# Patient Record
Sex: Female | Born: 1990 | Race: White | Hispanic: No | Marital: Single | Smoking: Current some day smoker
Health system: Southern US, Community
[De-identification: ages and names within clinical notes are randomized; demographics above are authoritative.]

## PROBLEM LIST (undated history)

## (undated) ENCOUNTER — Inpatient Hospital Stay (HOSPITAL_COMMUNITY): Payer: Self-pay

## (undated) ENCOUNTER — Inpatient Hospital Stay: Payer: Self-pay

## (undated) DIAGNOSIS — F32A Depression, unspecified: Secondary | ICD-10-CM

## (undated) DIAGNOSIS — F419 Anxiety disorder, unspecified: Secondary | ICD-10-CM

## (undated) DIAGNOSIS — O98812 Other maternal infectious and parasitic diseases complicating pregnancy, second trimester: Secondary | ICD-10-CM

## (undated) DIAGNOSIS — F329 Major depressive disorder, single episode, unspecified: Secondary | ICD-10-CM

## (undated) DIAGNOSIS — D649 Anemia, unspecified: Secondary | ICD-10-CM

## (undated) DIAGNOSIS — A749 Chlamydial infection, unspecified: Secondary | ICD-10-CM

## (undated) HISTORY — PX: NO PAST SURGERIES: SHX2092

---
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
## 2012-08-29 ENCOUNTER — Emergency Department: Payer: Self-pay | Admitting: Emergency Medicine

## 2012-08-29 LAB — CBC
HCT: 34.6 % — ABNORMAL LOW (ref 35.0–47.0)
HGB: 12 g/dL (ref 12.0–16.0)
MCH: 28.9 pg (ref 26.0–34.0)
MCHC: 34.6 g/dL (ref 32.0–36.0)
Platelet: 238 10*3/uL (ref 150–440)
RBC: 4.13 10*6/uL (ref 3.80–5.20)
RDW: 13.7 % (ref 11.5–14.5)
WBC: 9 10*3/uL (ref 3.6–11.0)

## 2012-08-29 LAB — URINALYSIS, COMPLETE
Bilirubin,UR: NEGATIVE
Blood: NEGATIVE
Ketone: NEGATIVE
Nitrite: NEGATIVE
Ph: 7 (ref 4.5–8.0)
Protein: NEGATIVE
WBC UR: 1 /HPF (ref 0–5)

## 2013-01-24 ENCOUNTER — Observation Stay: Payer: Self-pay | Admitting: Obstetrics & Gynecology

## 2013-01-24 LAB — CBC WITH DIFFERENTIAL/PLATELET
Basophil #: 0.1 10*3/uL (ref 0.0–0.1)
Basophil %: 1.3 %
EOS ABS: 0.2 10*3/uL (ref 0.0–0.7)
Eosinophil %: 2 %
HCT: 30.8 % — ABNORMAL LOW (ref 35.0–47.0)
HGB: 10.6 g/dL — AB (ref 12.0–16.0)
LYMPHS PCT: 21.3 %
Lymphocyte #: 2.2 10*3/uL (ref 1.0–3.6)
MCH: 27.8 pg (ref 26.0–34.0)
MCHC: 34.4 g/dL (ref 32.0–36.0)
MCV: 81 fL (ref 80–100)
Monocyte #: 0.7 x10 3/mm (ref 0.2–0.9)
Monocyte %: 7 %
Neutrophil #: 6.9 10*3/uL — ABNORMAL HIGH (ref 1.4–6.5)
Neutrophil %: 68.4 %
PLATELETS: 313 10*3/uL (ref 150–440)
RBC: 3.81 10*6/uL (ref 3.80–5.20)
RDW: 13.4 % (ref 11.5–14.5)
WBC: 10.2 10*3/uL (ref 3.6–11.0)

## 2013-01-24 LAB — PROTIME-INR
INR: 1
PROTHROMBIN TIME: 13.2 s (ref 11.5–14.7)

## 2013-01-24 LAB — APTT: Activated PTT: 27.1 secs (ref 23.6–35.9)

## 2013-02-12 ENCOUNTER — Observation Stay: Payer: Self-pay | Admitting: Obstetrics and Gynecology

## 2013-02-12 LAB — GLUCOSE, RANDOM: Glucose: 81 mg/dL (ref 65–99)

## 2013-02-12 LAB — DRUG SCREEN, URINE
AMPHETAMINES, UR SCREEN: NEGATIVE (ref ?–1000)
BENZODIAZEPINE, UR SCRN: NEGATIVE (ref ?–200)
Barbiturates, Ur Screen: NEGATIVE (ref ?–200)
CANNABINOID 50 NG, UR ~~LOC~~: POSITIVE (ref ?–50)
COCAINE METABOLITE, UR ~~LOC~~: NEGATIVE (ref ?–300)
MDMA (ECSTASY) UR SCREEN: NEGATIVE (ref ?–500)
Methadone, Ur Screen: NEGATIVE (ref ?–300)
OPIATE, UR SCREEN: NEGATIVE (ref ?–300)
PHENCYCLIDINE (PCP) UR S: NEGATIVE (ref ?–25)
Tricyclic, Ur Screen: NEGATIVE (ref ?–1000)

## 2013-02-12 LAB — WBC: WBC: 9.7 10*3/uL (ref 3.6–11.0)

## 2013-02-12 LAB — HEMATOCRIT: HCT: 32.3 % — AB (ref 35.0–47.0)

## 2013-02-12 LAB — HEMOGLOBIN: HGB: 10.7 g/dL — ABNORMAL LOW (ref 12.0–16.0)

## 2013-02-12 LAB — GC/CHLAMYDIA PROBE AMP

## 2013-02-12 LAB — RAPID HIV-1/2 QL/CONFIRM: HIV-1/2, RAPID QL: NEGATIVE

## 2013-02-12 LAB — PLATELET COUNT: Platelet: 295 10*3/uL (ref 150–440)

## 2013-02-14 ENCOUNTER — Inpatient Hospital Stay: Payer: Self-pay | Admitting: Obstetrics and Gynecology

## 2013-02-14 LAB — CBC WITH DIFFERENTIAL/PLATELET
BASOS ABS: 0.1 10*3/uL (ref 0.0–0.1)
Basophil %: 0.6 %
EOS ABS: 0.3 10*3/uL (ref 0.0–0.7)
EOS PCT: 2.4 %
HCT: 30.8 % — ABNORMAL LOW (ref 35.0–47.0)
HGB: 10.3 g/dL — ABNORMAL LOW (ref 12.0–16.0)
LYMPHS ABS: 1.6 10*3/uL (ref 1.0–3.6)
LYMPHS PCT: 15.1 %
MCH: 27.4 pg (ref 26.0–34.0)
MCHC: 33.5 g/dL (ref 32.0–36.0)
MCV: 82 fL (ref 80–100)
MONO ABS: 0.7 x10 3/mm (ref 0.2–0.9)
MONOS PCT: 6.8 %
NEUTROS ABS: 8 10*3/uL — AB (ref 1.4–6.5)
NEUTROS PCT: 75.1 %
Platelet: 294 10*3/uL (ref 150–440)
RBC: 3.76 10*6/uL — AB (ref 3.80–5.20)
RDW: 14.3 % (ref 11.5–14.5)
WBC: 10.7 10*3/uL (ref 3.6–11.0)

## 2013-02-14 LAB — GC/CHLAMYDIA PROBE AMP

## 2013-02-16 LAB — HEMATOCRIT: HCT: 27.1 % — AB (ref 35.0–47.0)
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
## 2014-05-05 NOTE — Consult Note (Signed)
Brief Consult Note: Diagnosis: adjustment disorder with anxiety.   Patient was seen by consultant.   Consult note dictated.   Orders entered.   Comments: PSychiatry: Pt seen. chart reviewed. Spoke with attending and current nurse. Full note dictated. Bottom line: No indication to start any medication right now. No sign of dangerousness that would require any longer hospital stay. Patient should be strongly urged to go to [facility] [address] [place] [phone] for follow up. She can call them or go to walk in eval M,W,F 8am-3pm. They provide full range mental health services including counceling and evaluation.  Electronic Signatures: Audery Amellapacs, Wilbert Schouten T (MD)  (Signed 06-Feb-15 12:50)  Authored: Brief Consult Note   Last Updated: 06-Feb-15 12:50 by Audery Amellapacs, Mory Herrman T (MD)
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
## 2015-12-29 ENCOUNTER — Observation Stay
Admission: EM | Admit: 2015-12-29 | Discharge: 2015-12-29 | Disposition: A | Discharge status: Home / Self Care | Payer: Medicaid Other | Attending: Obstetrics and Gynecology | Admitting: Obstetrics and Gynecology

## 2015-12-29 DIAGNOSIS — O98813 Other maternal infectious and parasitic diseases complicating pregnancy, third trimester: Secondary | ICD-10-CM | POA: Insufficient documentation

## 2015-12-29 DIAGNOSIS — O26899 Other specified pregnancy related conditions, unspecified trimester: Secondary | ICD-10-CM | POA: Diagnosis present

## 2015-12-29 DIAGNOSIS — R1030 Lower abdominal pain, unspecified: Secondary | ICD-10-CM | POA: Insufficient documentation

## 2015-12-29 DIAGNOSIS — O99333 Smoking (tobacco) complicating pregnancy, third trimester: Secondary | ICD-10-CM | POA: Insufficient documentation

## 2015-12-29 DIAGNOSIS — Z3A28 28 weeks gestation of pregnancy: Secondary | ICD-10-CM | POA: Insufficient documentation

## 2015-12-29 DIAGNOSIS — O26893 Other specified pregnancy related conditions, third trimester: Secondary | ICD-10-CM | POA: Diagnosis not present

## 2015-12-29 DIAGNOSIS — R109 Unspecified abdominal pain: Secondary | ICD-10-CM

## 2015-12-29 DIAGNOSIS — O98313 Other infections with a predominantly sexual mode of transmission complicating pregnancy, third trimester: Secondary | ICD-10-CM | POA: Insufficient documentation

## 2015-12-29 DIAGNOSIS — F17210 Nicotine dependence, cigarettes, uncomplicated: Secondary | ICD-10-CM | POA: Diagnosis not present

## 2015-12-29 DIAGNOSIS — A5901 Trichomonal vulvovaginitis: Secondary | ICD-10-CM | POA: Diagnosis not present

## 2015-12-29 DIAGNOSIS — A749 Chlamydial infection, unspecified: Secondary | ICD-10-CM | POA: Diagnosis not present

## 2015-12-29 DIAGNOSIS — O0933 Supervision of pregnancy with insufficient antenatal care, third trimester: Secondary | ICD-10-CM | POA: Diagnosis not present

## 2015-12-29 HISTORY — DX: Anxiety disorder, unspecified: F41.9

## 2015-12-29 LAB — OB RESULTS CONSOLE GC/CHLAMYDIA
Chlamydia: NEGATIVE
GC PROBE AMP, GENITAL: NEGATIVE

## 2015-12-29 LAB — URINALYSIS, COMPLETE (UACMP) WITH MICROSCOPIC
BILIRUBIN URINE: NEGATIVE
Glucose, UA: NEGATIVE mg/dL
Hgb urine dipstick: NEGATIVE
KETONES UR: NEGATIVE mg/dL
Nitrite: NEGATIVE
PROTEIN: NEGATIVE mg/dL
Specific Gravity, Urine: 1.013 (ref 1.005–1.030)
pH: 7 (ref 5.0–8.0)

## 2015-12-29 LAB — WET PREP, GENITAL
Clue Cells Wet Prep HPF POC: NONE SEEN
Sperm: NONE SEEN
YEAST WET PREP: NONE SEEN

## 2015-12-29 MED ORDER — METRONIDAZOLE 500 MG PO TABS
2000.0000 mg | ORAL_TABLET | ORAL | Status: AC
  Administered 2015-12-29: 2000 mg via ORAL
  Filled 2015-12-29: qty 4

## 2015-12-29 NOTE — OB Triage Note (Signed)
Patient presented to L&D complaining of constant lower abdominal pain that started last night. Patient states the pain has progressively gotten worse throughout the day.  Denies any vaginal or leaking of fluid.  States fetal movement is somewhat decreased.

## 2015-12-29 NOTE — Discharge Summary (Signed)
  See FPN 
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
## 2015-12-30 LAB — CHLAMYDIA/NGC RT PCR (ARMC ONLY)
Chlamydia Tr: DETECTED — AB
N gonorrhoeae: NOT DETECTED

## 2016-01-02 LAB — OB RESULTS CONSOLE HIV ANTIBODY (ROUTINE TESTING): HIV: NONREACTIVE

## 2016-01-02 LAB — HM HIV SCREENING LAB: HM HIV Screening: NEGATIVE

## 2016-01-03 LAB — OB RESULTS CONSOLE VARICELLA ZOSTER ANTIBODY, IGG: VARICELLA IGG: IMMUNE

## 2016-01-03 LAB — OB RESULTS CONSOLE ABO/RH: RH Type: POSITIVE

## 2016-01-03 LAB — OB RESULTS CONSOLE RPR: RPR: NONREACTIVE

## 2016-01-03 LAB — OB RESULTS CONSOLE ANTIBODY SCREEN: Antibody Screen: NEGATIVE

## 2016-01-03 LAB — OB RESULTS CONSOLE RUBELLA ANTIBODY, IGM: RUBELLA: IMMUNE

## 2016-01-03 LAB — OB RESULTS CONSOLE HEPATITIS B SURFACE ANTIGEN: Hepatitis B Surface Ag: NEGATIVE

## 2016-01-13 NOTE — L&D Delivery Note (Signed)
Obstetrical Delivery Note   Date of Delivery:   04/04/2016 Primary OB:   [facility] Gestational Age/EDD: 6557w6d (Dated by LMP=15 week ultrasound) Antepartum complications: late onset prenatal care, tobacco use, anemia, depression, Chlamydia and Trich during pregnancy with neg TOC)  Delivered By:   Farrel Connersolleen Jesyka Slaght, CNM  Delivery Type:   spontaneous vaginal delivery  Procedure Details:   Mother pushed to deliver a viable female infant in [place] with occult cord and body cord noted. Tight shoulder resolved with placing head of bed down and McRoberts maneuver. Spontaneous cry with tactile stimulation. Spontaneous delivery of intact placenta and 3 vessel cord. Brisk bleeding after delivery resolved with massage, IV Pitocin and evacuation of clots from the lower uterine segment. No perineal or vaginal lacerations noted. Unable to visualize her cervix. Anesthesia:    epidural Intrapartum complications: Non-reassuring Fetal Status (Cat 2 tracing with repetitive variable decelerations) which responded to amnioinfusion, position change, stopping Pitocin and O2). GBS:    negative Laceration:    none Episiotomy:    none Placenta:    Via active 3rd stage. To pathology: no Estimated Blood Loss:  450 ml  Baby:    Liveborn female, Apgars 8/8, weight 8#6.8 oz    Farrel Connersolleen Kylie Simmonds, CNM

## 2016-01-16 ENCOUNTER — Other Ambulatory Visit: Payer: Self-pay | Admitting: Physician Assistant

## 2016-01-16 DIAGNOSIS — O0993 Supervision of high risk pregnancy, unspecified, third trimester: Secondary | ICD-10-CM

## 2016-01-20 ENCOUNTER — Ambulatory Visit
Admission: RE | Admit: 2016-01-20 | Discharge: 2016-01-20 | Disposition: A | Discharge status: Home / Self Care | Payer: Medicaid Other | Source: Ambulatory Visit | Attending: Physician Assistant | Admitting: Physician Assistant

## 2016-02-08 LAB — OB RESULTS CONSOLE GC/CHLAMYDIA
Chlamydia: NEGATIVE
Gonorrhea: NEGATIVE

## 2016-03-06 LAB — OB RESULTS CONSOLE GBS: STREP GROUP B AG: NEGATIVE

## 2016-03-30 ENCOUNTER — Ambulatory Visit: Payer: Medicaid Other

## 2016-03-30 DIAGNOSIS — O0993 Supervision of high risk pregnancy, unspecified, third trimester: Secondary | ICD-10-CM

## 2016-04-02 ENCOUNTER — Observation Stay
Admission: EM | Admit: 2016-04-02 | Discharge: 2016-04-02 | Disposition: A | Discharge status: Home / Self Care | Payer: Medicaid Other | Attending: Obstetrics and Gynecology | Admitting: Obstetrics and Gynecology

## 2016-04-02 DIAGNOSIS — O471 False labor at or after 37 completed weeks of gestation: Secondary | ICD-10-CM

## 2016-04-02 DIAGNOSIS — Z3A40 40 weeks gestation of pregnancy: Secondary | ICD-10-CM | POA: Insufficient documentation

## 2016-04-02 NOTE — Plan of Care (Signed)
Pt seen by dr Bonney Aidstaebler.EFM reactive. Occasional contractions. Will d/c pt home with d/c instructions. Pt to come in on Saturday for induction.

## 2016-04-02 NOTE — Discharge Summary (Signed)
See final progress note. 

## 2016-04-02 NOTE — Final Progress Note (Addendum)
Physician Final Progress Note  Patient ID: [name] MRN: [id number] DOB/AGE: 24/05/1990 24 y.o.  Admit date: 04/02/2016 Admitting provider: Vena AustriaAndreas Aramis Zobel, MD Discharge date: 04/02/2016   Admission Diagnoses: Contractions  Discharge Diagnoses:  Active Problems:   Labor and delivery indication for care or intervention   26 yo G3P2002 at 5766w4d by LMP=15week US presenting with irregular contractions.  Only 1cm dilated on presentation  Consults: None  Significant Findings/ Diagnostic Studies: none  Procedures:  Baseline: 145 Variability: moderate Accelerations: present Decelerations: absent Tocometry: irregular, every 6-8 minutes The patient was monitored for 30 minutes, fetal heart rate tracing was deemed reactive, category I tracing,   Discharge Condition: good  Disposition: 01-Home or Self Care  Diet: Regular diet  Discharge Activity: Activity as tolerated  Discharge Instructions    Discharge activity:  No Restrictions    Complete by:  As directed    Discharge diet:  No restrictions    Complete by:  As directed    Fetal Kick Count:  Lie on our left side for one hour after a meal, and count the number of times your baby kicks.  If it is less than 5 times, get up, move around and drink some juice.  Repeat the test 30 minutes later.  If it is still less than 5 kicks in an hour, notify your doctor.    Complete by:  As directed    LABOR:  When conractions begin, you should start to time them from the beginning of one contraction to the beginning  of the next.  When contractions are 5 - 10 minutes apart or less and have been regular for at least an hour, you should call your health care provider.    Complete by:  As directed    No sexual activity restrictions    Complete by:  As directed    Notify physician for bleeding from the vagina    Complete by:  As directed    Notify physician for blurring of vision or spots before the eyes    Complete by:  As directed    Notify physician for chills or fever    Complete by:  As directed    Notify physician for fainting spells, "black outs" or loss of consciousness    Complete by:  As directed    Notify physician for increase in vaginal discharge    Complete by:  As directed    Notify physician for leaking of fluid    Complete by:  As directed    Notify physician for pain or burning when urinating    Complete by:  As directed    Notify physician for pelvic pressure (sudden increase)    Complete by:  As directed    Notify physician for severe or continued nausea or vomiting    Complete by:  As directed    Notify physician for sudden gushing of fluid from the vagina (with or without continued leaking)    Complete by:  As directed    Notify physician for sudden, constant, or occasional abdominal pain    Complete by:  As directed    Notify physician if baby moving less than usual    Complete by:  As directed      Allergies as of 04/02/2016   No Known Allergies     Medication List    TAKE these medications   ferrous sulfate 325 (65 FE) MG tablet Take 325 mg by mouth daily with breakfast.   PRENATAL VITAMINS  PO Take by mouth.        Total time spent taking care of this patient: triaged remotely IOL scheduled for Saturday 04/04/16  Signed: Vena Austria 04/02/2016, 2:42 PM

## 2016-04-02 NOTE — Plan of Care (Signed)
Dr Bonney Aidstaebler on call now and aware of pt's presence on unit

## 2016-04-02 NOTE — OB Triage Note (Signed)
Pt sent over from [facility] after appointment this morning.

## 2016-04-03 ENCOUNTER — Observation Stay
Admission: EM | Admit: 2016-04-03 | Discharge: 2016-04-03 | Disposition: A | Discharge status: Home / Self Care | Payer: Medicaid Other | Source: Home / Self Care | Admitting: Obstetrics and Gynecology

## 2016-04-03 DIAGNOSIS — O99333 Smoking (tobacco) complicating pregnancy, third trimester: Secondary | ICD-10-CM

## 2016-04-03 DIAGNOSIS — Z3A40 40 weeks gestation of pregnancy: Secondary | ICD-10-CM | POA: Insufficient documentation

## 2016-04-03 DIAGNOSIS — F17210 Nicotine dependence, cigarettes, uncomplicated: Secondary | ICD-10-CM

## 2016-04-03 DIAGNOSIS — Z0379 Encounter for other suspected maternal and fetal conditions ruled out: Secondary | ICD-10-CM | POA: Diagnosis not present

## 2016-04-03 NOTE — OB Triage Note (Signed)
Patient reports to triage with complaints of "water leaking" several small gushes this morning and during the night. Nitrazine negative.

## 2016-04-03 NOTE — Discharge Summary (Signed)
Physician Final Progress Note  Patient ID: [name] MRN: [id number] DOB/AGE: 1990/02/19 26 y.o.  Admit date: 04/03/2016 Admitting provider: Tresea Mall, CNM Discharge date: 04/03/2016   Admission Diagnoses: leaking fluid  Discharge Diagnoses:  Active Problems:   * No active hospital problems. * IUP at [redacted]w[redacted]d with reactive NST, membranes intact  History of Present Illness: The patient is a 26 y.o. female G3P2002 at [redacted]w[redacted]d who presents for leakage of fluid overnight. She reports two small gushes overnight waking up with the bed feeling wet. Pt feels braxton hicks contractions. She admits positive fetal movement. She denies vaginal bleeding.   Past Medical History:  Diagnosis Date  . Anxiety     Past Surgical History:  Procedure Laterality Date  . NO PAST SURGERIES      No current facility-administered medications on file prior to encounter.    Current Outpatient Prescriptions on File Prior to Encounter  Medication Sig Dispense Refill  . ferrous sulfate 325 (65 FE) MG tablet Take 325 mg by mouth daily with breakfast.    . Prenatal Multivit-Min-Fe-FA (PRENATAL VITAMINS PO) Take by mouth.      No Known Allergies  Social History   Social History  . Marital status: Single    Spouse name: N/A  . Number of children: N/A  . Years of education: N/A   Occupational History  . Not on file.   Social History Main Topics  . Smoking status: Smoker, Current Status Unknown    Packs/day: 0.33    Years: 4.00    Types: Cigarettes  . Smokeless tobacco: Never Used  . Alcohol use No  . Drug use: No  . Sexual activity: Yes    Birth control/ protection: Surgical     Comment: desires tubal ligation   Other Topics Concern  . Not on file   Social History Narrative  . No narrative on file    Physical Exam: BP 112/66   Pulse (!) 119   Temp 98.3 F (36.8 C) (Oral)   Gen: NAD CV: RRR Pulm: CTAB Pelvic: 1.5/50/-3 Toco: every 8-12 minutes Fetal Well Being: 150 bpm, moderate  variability, +accelerations, -decelerations Ext: no evidence of DVT  Consults: None  Significant Findings/ Diagnostic Studies: none  Procedures: NST  Discharge Condition: good  Disposition: 01-Home or Self Care  Diet: Regular diet  Discharge Activity: Activity as tolerated  Discharge Instructions    Discharge activity:  No Restrictions    Complete by:  As directed    Return for scheduled induction on 3/24   Discharge diet:  No restrictions    Complete by:  As directed    LABOR:  When conractions begin, you should start to time them from the beginning of one contraction to the beginning  of the next.  When contractions are 5 - 10 minutes apart or less and have been regular for at least an hour, you should call your health care provider.    Complete by:  As directed    No sexual activity restrictions    Complete by:  As directed    Notify physician for bleeding from the vagina    Complete by:  As directed    Notify physician for blurring of vision or spots before the eyes    Complete by:  As directed    Notify physician for chills or fever    Complete by:  As directed    Notify physician for fainting spells, "black outs" or loss of consciousness    Complete  by:  As directed    Notify physician for increase in vaginal discharge    Complete by:  As directed    Notify physician for leaking of fluid    Complete by:  As directed    Notify physician for pain or burning when urinating    Complete by:  As directed    Notify physician for pelvic pressure (sudden increase)    Complete by:  As directed    Notify physician for severe or continued nausea or vomiting    Complete by:  As directed    Notify physician for sudden gushing of fluid from the vagina (with or without continued leaking)    Complete by:  As directed    Notify physician for sudden, constant, or occasional abdominal pain    Complete by:  As directed    Notify physician if baby moving less than usual    Complete  by:  As directed      Allergies as of 04/03/2016   No Known Allergies     Medication List    TAKE these medications   ferrous sulfate 325 (65 FE) MG tablet Take 325 mg by mouth daily with breakfast.   PRENATAL VITAMINS PO Take by mouth.      return for scheduled induction on 04/04/2016  Total time spent taking care of this patient: 15 minutes  Signed: Tresea MallJane Glanda Spanbauer, CNM  04/03/2016, 9:23 AM

## 2016-04-03 NOTE — Discharge Instructions (Signed)
Discharge instructions given, patient verbalized understanding. Labor precautions given, patient is scheduled for induction tomorrow night.

## 2016-04-04 ENCOUNTER — Inpatient Hospital Stay: Payer: Medicaid Other | Admitting: Anesthesiology

## 2016-04-04 ENCOUNTER — Encounter: Payer: Self-pay | Admitting: *Deleted

## 2016-04-04 ENCOUNTER — Inpatient Hospital Stay
Admission: EM | Admit: 2016-04-04 | Discharge: 2016-04-06 | DRG: 775 | Disposition: A | Discharge status: Home / Self Care | Payer: Medicaid Other | Attending: Certified Nurse Midwife | Admitting: Certified Nurse Midwife

## 2016-04-04 DIAGNOSIS — D649 Anemia, unspecified: Secondary | ICD-10-CM | POA: Diagnosis present

## 2016-04-04 DIAGNOSIS — K219 Gastro-esophageal reflux disease without esophagitis: Secondary | ICD-10-CM | POA: Diagnosis present

## 2016-04-04 DIAGNOSIS — O9962 Diseases of the digestive system complicating childbirth: Secondary | ICD-10-CM | POA: Diagnosis present

## 2016-04-04 DIAGNOSIS — F329 Major depressive disorder, single episode, unspecified: Secondary | ICD-10-CM | POA: Diagnosis present

## 2016-04-04 DIAGNOSIS — Z3493 Encounter for supervision of normal pregnancy, unspecified, third trimester: Secondary | ICD-10-CM | POA: Diagnosis present

## 2016-04-04 DIAGNOSIS — O9902 Anemia complicating childbirth: Secondary | ICD-10-CM | POA: Diagnosis present

## 2016-04-04 DIAGNOSIS — O480 Post-term pregnancy: Secondary | ICD-10-CM | POA: Diagnosis present

## 2016-04-04 DIAGNOSIS — O99344 Other mental disorders complicating childbirth: Secondary | ICD-10-CM | POA: Diagnosis present

## 2016-04-04 DIAGNOSIS — O4202 Full-term premature rupture of membranes, onset of labor within 24 hours of rupture: Secondary | ICD-10-CM | POA: Diagnosis present

## 2016-04-04 DIAGNOSIS — R202 Paresthesia of skin: Secondary | ICD-10-CM | POA: Diagnosis not present

## 2016-04-04 DIAGNOSIS — Z3A40 40 weeks gestation of pregnancy: Secondary | ICD-10-CM

## 2016-04-04 DIAGNOSIS — F17210 Nicotine dependence, cigarettes, uncomplicated: Secondary | ICD-10-CM | POA: Diagnosis present

## 2016-04-04 DIAGNOSIS — O99334 Smoking (tobacco) complicating childbirth: Secondary | ICD-10-CM | POA: Diagnosis present

## 2016-04-04 HISTORY — DX: Other maternal infectious and parasitic diseases complicating pregnancy, second trimester: O98.812

## 2016-04-04 HISTORY — DX: Anemia, unspecified: D64.9

## 2016-04-04 HISTORY — DX: Major depressive disorder, single episode, unspecified: F32.9

## 2016-04-04 HISTORY — DX: Chlamydial infection, unspecified: A74.9

## 2016-04-04 HISTORY — DX: Depression, unspecified: F32.A

## 2016-04-04 LAB — TYPE AND SCREEN
ABO/RH(D): O POS
ANTIBODY SCREEN: NEGATIVE

## 2016-04-04 LAB — CBC
HCT: 30.7 % — ABNORMAL LOW (ref 35.0–47.0)
HEMOGLOBIN: 10.1 g/dL — AB (ref 12.0–16.0)
MCH: 26.4 pg (ref 26.0–34.0)
MCHC: 32.9 g/dL (ref 32.0–36.0)
MCV: 80.4 fL (ref 80.0–100.0)
Platelets: 314 10*3/uL (ref 150–440)
RBC: 3.81 MIL/uL (ref 3.80–5.20)
RDW: 16.8 % — AB (ref 11.5–14.5)
WBC: 13.9 10*3/uL — ABNORMAL HIGH (ref 3.6–11.0)

## 2016-04-04 LAB — CHLAMYDIA/NGC RT PCR (ARMC ONLY)
CHLAMYDIA TR: NOT DETECTED
N GONORRHOEAE: NOT DETECTED

## 2016-04-04 MED ORDER — IBUPROFEN 600 MG PO TABS
600.0000 mg | ORAL_TABLET | Freq: Four times a day (QID) | ORAL | Status: DC
  Administered 2016-04-05 – 2016-04-06 (×7): 600 mg via ORAL
  Filled 2016-04-04 (×8): qty 1

## 2016-04-04 MED ORDER — MISOPROSTOL 200 MCG PO TABS
ORAL_TABLET | ORAL | Status: AC
  Filled 2016-04-04: qty 4

## 2016-04-04 MED ORDER — OXYTOCIN 40 UNITS IN LACTATED RINGERS INFUSION - SIMPLE MED
INTRAVENOUS | Status: AC
  Administered 2016-04-04: 500 mL via INTRAVENOUS
  Filled 2016-04-04: qty 1000

## 2016-04-04 MED ORDER — WITCH HAZEL-GLYCERIN EX PADS: 1.0000 "application " | MEDICATED_PAD | CUTANEOUS | Status: DC | PRN

## 2016-04-04 MED ORDER — PHENYLEPHRINE 40 MCG/ML (10ML) SYRINGE FOR IV PUSH (FOR BLOOD PRESSURE SUPPORT): 80.0000 ug | PREFILLED_SYRINGE | INTRAVENOUS | Status: DC | PRN

## 2016-04-04 MED ORDER — LIDOCAINE HCL (PF) 1 % IJ SOLN
INTRAMUSCULAR | Status: AC
  Filled 2016-04-04: qty 30

## 2016-04-04 MED ORDER — ONDANSETRON HCL 4 MG PO TABS: 4.0000 mg | ORAL_TABLET | ORAL | Status: DC | PRN

## 2016-04-04 MED ORDER — LACTATED RINGERS IV SOLN: 500.0000 mL | Freq: Once | INTRAVENOUS | Status: AC

## 2016-04-04 MED ORDER — FENTANYL 2.5 MCG/ML W/ROPIVACAINE 0.2% IN NS 100 ML EPIDURAL INFUSION (ARMC-ANES): 10.0000 mL/h | EPIDURAL | Status: DC

## 2016-04-04 MED ORDER — OXYCODONE-ACETAMINOPHEN 5-325 MG PO TABS: 2.0000 | ORAL_TABLET | ORAL | Status: DC | PRN

## 2016-04-04 MED ORDER — PRENATAL MULTIVITAMIN CH
1.0000 | ORAL_TABLET | Freq: Every day | ORAL | Status: DC
  Administered 2016-04-05 – 2016-04-06 (×2): 1 via ORAL
  Filled 2016-04-04 (×2): qty 1

## 2016-04-04 MED ORDER — OXYTOCIN 40 UNITS IN LACTATED RINGERS INFUSION - SIMPLE MED: 2.5000 [IU]/h | INTRAVENOUS | Status: DC

## 2016-04-04 MED ORDER — EPHEDRINE 5 MG/ML INJ: 10.0000 mg | INTRAVENOUS | Status: DC | PRN

## 2016-04-04 MED ORDER — AMMONIA AROMATIC IN INHA
RESPIRATORY_TRACT | Status: AC
  Filled 2016-04-04: qty 10

## 2016-04-04 MED ORDER — COCONUT OIL OIL
1.0000 "application " | TOPICAL_OIL | Status: DC | PRN
  Administered 2016-04-05: 1 via TOPICAL
  Filled 2016-04-04: qty 120

## 2016-04-04 MED ORDER — DIPHENHYDRAMINE HCL 50 MG/ML IJ SOLN: 12.5000 mg | INTRAMUSCULAR | Status: DC | PRN

## 2016-04-04 MED ORDER — FENTANYL CITRATE (PF) 100 MCG/2ML IJ SOLN
50.0000 ug | INTRAMUSCULAR | Status: DC | PRN
  Administered 2016-04-04: 100 ug via INTRAVENOUS
  Filled 2016-04-04: qty 2

## 2016-04-04 MED ORDER — DIBUCAINE 1 % RE OINT: 1.0000 "application " | TOPICAL_OINTMENT | RECTAL | Status: DC | PRN

## 2016-04-04 MED ORDER — ONDANSETRON HCL 4 MG/2ML IJ SOLN: 4.0000 mg | Freq: Four times a day (QID) | INTRAMUSCULAR | Status: DC | PRN

## 2016-04-04 MED ORDER — FENTANYL 2.5 MCG/ML W/ROPIVACAINE 0.2% IN NS 100 ML EPIDURAL INFUSION (ARMC-ANES)
EPIDURAL | Status: DC | PRN
  Administered 2016-04-04: 10 mL/h via EPIDURAL

## 2016-04-04 MED ORDER — FERROUS SULFATE 325 (65 FE) MG PO TABS
325.0000 mg | ORAL_TABLET | Freq: Every day | ORAL | Status: DC
  Administered 2016-04-05: 325 mg via ORAL
  Filled 2016-04-04 (×2): qty 1

## 2016-04-04 MED ORDER — ROPIVACAINE HCL 2 MG/ML IJ SOLN
10.0000 mL/h | INTRAMUSCULAR | Status: DC
  Filled 2016-04-04: qty 5

## 2016-04-04 MED ORDER — LIDOCAINE-EPINEPHRINE (PF) 1.5 %-1:200000 IJ SOLN
INTRAMUSCULAR | Status: DC | PRN
Start: 2016-04-04 — End: 2016-04-04
  Administered 2016-04-04: 3 mL via EPIDURAL

## 2016-04-04 MED ORDER — MORPHINE SULFATE (PF) 10 MG/ML IV SOLN
INTRAVENOUS | Status: AC
  Administered 2016-04-04: 10 mg via INTRAMUSCULAR
  Filled 2016-04-04: qty 1

## 2016-04-04 MED ORDER — SENNOSIDES-DOCUSATE SODIUM 8.6-50 MG PO TABS
2.0000 | ORAL_TABLET | ORAL | Status: DC
  Administered 2016-04-05 – 2016-04-06 (×2): 2 via ORAL
  Filled 2016-04-04 (×2): qty 2

## 2016-04-04 MED ORDER — LIDOCAINE HCL (PF) 1 % IJ SOLN
30.0000 mL | INTRAMUSCULAR | Status: AC | PRN
  Administered 2016-04-04: 1.2 mL via SUBCUTANEOUS

## 2016-04-04 MED ORDER — LACTATED RINGERS IV SOLN
500.0000 mL | INTRAVENOUS | Status: DC | PRN
  Administered 2016-04-04: 1000 mL via INTRAVENOUS

## 2016-04-04 MED ORDER — BENZOCAINE-MENTHOL 20-0.5 % EX AERO
1.0000 "application " | INHALATION_SPRAY | CUTANEOUS | Status: DC | PRN
  Administered 2016-04-04: 1 via TOPICAL
  Filled 2016-04-04 (×2): qty 56

## 2016-04-04 MED ORDER — OXYTOCIN 40 UNITS IN LACTATED RINGERS INFUSION - SIMPLE MED
1.0000 m[IU]/min | INTRAVENOUS | Status: DC
Start: 2016-04-04 — End: 2016-04-04
  Administered 2016-04-04: 1 m[IU]/min via INTRAVENOUS

## 2016-04-04 MED ORDER — FENTANYL 2.5 MCG/ML W/ROPIVACAINE 0.2% IN NS 100 ML EPIDURAL INFUSION (ARMC-ANES)
EPIDURAL | Status: AC
  Filled 2016-04-04: qty 100

## 2016-04-04 MED ORDER — LACTATED RINGERS IV SOLN
INTRAVENOUS | Status: DC
  Administered 2016-04-04 (×2): via INTRAVENOUS

## 2016-04-04 MED ORDER — MORPHINE SULFATE (PF) 10 MG/ML IV SOLN
10.0000 mg | Freq: Once | INTRAVENOUS | Status: AC | PRN
  Administered 2016-04-04: 10 mg via INTRAMUSCULAR

## 2016-04-04 MED ORDER — ONDANSETRON HCL 4 MG/2ML IJ SOLN: 4.0000 mg | INTRAMUSCULAR | Status: DC | PRN

## 2016-04-04 MED ORDER — SIMETHICONE 80 MG PO CHEW: 80.0000 mg | CHEWABLE_TABLET | ORAL | Status: DC | PRN

## 2016-04-04 MED ORDER — OXYTOCIN 10 UNIT/ML IJ SOLN
INTRAMUSCULAR | Status: AC
  Filled 2016-04-04: qty 2

## 2016-04-04 MED ORDER — TERBUTALINE SULFATE 1 MG/ML IJ SOLN: 0.2500 mg | Freq: Once | INTRAMUSCULAR | Status: DC | PRN

## 2016-04-04 MED ORDER — OXYCODONE-ACETAMINOPHEN 5-325 MG PO TABS
1.0000 | ORAL_TABLET | ORAL | Status: DC | PRN
  Administered 2016-04-04 – 2016-04-06 (×3): 1 via ORAL
  Filled 2016-04-04 (×4): qty 1

## 2016-04-04 MED ORDER — OXYTOCIN BOLUS FROM INFUSION
500.0000 mL | Freq: Once | INTRAVENOUS | Status: AC
  Administered 2016-04-04: 500 mL via INTRAVENOUS

## 2016-04-04 NOTE — Discharge Summary (Signed)
Physician Obstetric Discharge Summary  Patient ID: [name] MRN: [id number] DOB/AGE: 24/05/1990 26 y.o.   Date of Admission: 04/04/2016  Date of Discharge: 04/06/2016  Admitting Diagnosis: Onset of Labor at 3023w6d  Secondary Diagnosis: Anemia in pregnancy and tobacco use, positive depression screen, treated for chlamydia and trichomoniasis in 12/2015 with TOC  Mode of Delivery: normal spontaneous vaginal delivery 04/04/2016      Discharge Diagnosis: Term intrauterine  pregnancy-delivered at 40wk6d   Intrapartum Procedures: epidural, pitocin augmentation, placement of intrauterine catheter and amnioinfusion   Post partum procedures: none  Complications: none   Brief Hospital Course  [name] I [name] is a [id number] who had a SVD on 04/04/2016;  for further details of this delivery, please refer to the delivery note.  Patient had an uncomplicated postpartum course.  By time of discharge on PPD#2, her pain was controlled on oral pain medications; she had appropriate lochia and was ambulating, voiding without difficulty and tolerating regular diet.  She has some pain and paresthesia in her back and left leg. Anesthesia to evaluate. She was deemed stable for discharge to home. Her baby is not ready to be discharged due to elevated bilirubin level and is under lights this morning. The patient is having a difficult time emotionally because she will have to leave without her baby. Reassurance has been given to patient that baby will be alright. She has also been visited by the chaplain.  Labs: CBC Latest Ref Rng & Units 04/05/2016 04/04/2016 02/16/2013  WBC 3.6 - 11.0 K/uL 13.9(H) 13.9(H) -  Hemoglobin 12.0 - 16.0 g/dL 5.6(O9.1(L) 10.1(L) -  Hematocrit 35.0 - 47.0 % 28.4(L) 30.7(L) 27.1(L)  Platelets 150 - 440 K/uL 292 314 -   O POS  Physical exam:  Blood pressure (!) 107/57, pulse (!) 124, temperature 98.2 F (36.8 C), temperature source Oral, resp. rate 16, height 5\' 4"  (1.626 m), weight 78.5 kg (173  lb), last menstrual period 06/23/2015, SpO2 100 %, currently pumping so she can breastfeed. General: alert and no distress Lochia: appropriate Abdomen: soft, NT Uterine Fundus: firm Incision: NA Extremities: No evidence of DVT seen on physical exam. No lower extremity edema.  Discharge Instructions: Per After Visit Summary. Activity: Advance as tolerated. Pelvic rest for 6 weeks.  Also refer to Discharge Instructions Diet: Regular Medications: Allergies as of 04/06/2016   No Known Allergies     Medication List    STOP taking these medications   ferrous sulfate 325 (65 FE) MG tablet     TAKE these medications   PRENATAL VITAMINS PO Take by mouth.      Outpatient follow up:  Follow-up Information    [name].   Why:  call to make 6 week postpartum check up. Contact information: [address] [place] [address] [place] [id number] [phone]          Postpartum contraception: undecided at this time  Discharged Condition: good  Discharged to: home   Newborn Data: Disposition:NICU Weight: 3820 g Apgars: APGAR (1 MIN): 8   APGAR (5 MINS): 8   APGAR (10 MINS):    Baby Feeding: Breast  [name], CNM 04/06/2016 9:59 AM

## 2016-04-04 NOTE — Discharge Instructions (Signed)
Vaginal Delivery, Care After Refer to this sheet in the next few weeks. These discharge instructions provide you with information on caring for yourself after delivery. Your caregiver may also give you specific instructions. Your treatment has been planned according to the most current medical practices available, but problems sometimes occur. Call your caregiver if you have any problems or questions after you go home. HOME CARE INSTRUCTIONS 1. Take over-the-counter or prescription medicines only as directed by your caregiver or pharmacist. 2. Do not drink alcohol, especially if you are breastfeeding or taking medicine to relieve pain. 3. Do not smoke tobacco. 4. Continue to use good perineal care. Good perineal care includes: 1. Wiping your perineum from back to front 2. Keeping your perineum clean. 3. You can do sitz baths twice a day, to help keep this area clean 5. Do not use tampons, douche or have sex until your caregiver says it is okay. 6. Shower only and avoid sitting in submerged water, aside from sitz baths 7. Wear a well-fitting bra that provides breast support. 8. Eat healthy foods. 9. Drink enough fluids to keep your urine clear or pale yellow. 10. Eat high-fiber foods such as whole grain cereals and breads, brown rice, beans, and fresh fruits and vegetables every day. These foods may help prevent or relieve constipation. 11. Avoid constipation with high fiber foods or medications, such as miralax or metamucil 12. Follow your caregiver's recommendations regarding resumption of activities such as climbing stairs, driving, lifting, exercising, or traveling. 13. Talk to your caregiver about resuming sexual activities. Resumption of sexual activities is dependent upon your risk of infection, your rate of healing, and your comfort and desire to resume sexual activity. 14. Try to have someone help you with your household activities and your newborn for at least a few days after you leave  the hospital. 15. Rest as much as possible. Try to rest or take a nap when your newborn is sleeping. 16. Increase your activities gradually. 17. Keep all of your scheduled postpartum appointments. It is very important to keep your scheduled follow-up appointments. At these appointments, your caregiver will be checking to make sure that you are healing physically and emotionally. SEEK MEDICAL CARE IF:   You are passing large clots from your vagina. Save any clots to show your caregiver.  You have a foul smelling discharge from your vagina.  You have trouble urinating.  You are urinating frequently.  You have pain when you urinate.  You have a change in your bowel movements.  You have increasing redness, pain, or swelling near your vaginal incision (episiotomy) or vaginal tear.  You have pus draining from your episiotomy or vaginal tear.  Your episiotomy or vaginal tear is separating.  You have painful, hard, or reddened breasts.  You have a severe headache.  You have blurred vision or see spots.  You feel sad or depressed.  You have thoughts of hurting yourself or your newborn.  You have questions about your care, the care of your newborn, or medicines.  You are dizzy or light-headed.  You have a rash.  You have nausea or vomiting.  You were breastfeeding and have not had a menstrual period within 12 weeks after you stopped breastfeeding.  You are not breastfeeding and have not had a menstrual period by the 12th week after delivery.  You have a fever. SEEK IMMEDIATE MEDICAL CARE IF:   You have persistent pain.  You have chest pain.  You have shortness of breath.    You faint.  You have leg pain.  You have stomach pain.  Your vaginal bleeding saturates two or more sanitary pads in 1 hour. MAKE SURE YOU:   Understand these instructions.  Will watch your condition.  Will get help right away if you are not doing well or get worse. Document Released:  12/27/1999 Document Revised: 05/15/2013 Document Reviewed: 08/26/2011 ExitCare Patient Information 2015 [employer]. This information is not intended to replace advice given to you by your health care provider. Make sure you discuss any questions you have with your health care provider.  Sitz Bath A sitz bath is a warm water bath taken in the sitting position. The water covers only the hips and butt (buttocks). We recommend using one that fits in the toilet, to help with ease of use and cleanliness. It may be used for either healing or cleaning purposes. Sitz baths are also used to relieve pain, itching, or muscle tightening (spasms). The water may contain medicine. Moist heat will help you heal and relax.  HOME CARE  Take 3 to 4 sitz baths a day. 18. Fill the bathtub half-full with warm water. 19. Sit in the water and open the drain a little. 20. Turn on the warm water to keep the tub half-full. Keep the water running constantly. 21. Soak in the water for 15 to 20 minutes. 22. After the sitz bath, pat the affected area dry. GET HELP RIGHT AWAY IF: You get worse instead of better. Stop the sitz baths if you get worse. MAKE SURE YOU:  Understand these instructions.  Will watch your condition.  Will get help right away if you are not doing well or get worse. Document Released: 02/06/2004 Document Revised: 09/23/2011 Document Reviewed: 04/28/2010 Exit[employer] Patient Information 2015 [employer]. This information is not intended to replace advice given to you by your health care provider. Make sure you discuss any questions you have with your health care provider.    

## 2016-04-04 NOTE — Anesthesia Preprocedure Evaluation (Signed)
Anesthesia Evaluation  Patient identified by MRN, date of birth, ID band Patient awake    Reviewed: Allergy & Precautions, NPO status , Patient's Chart, lab work & pertinent test results  History of Anesthesia Complications Negative for: history of anesthetic complications  Airway Mallampati: II       Dental   Pulmonary Current Smoker,           Cardiovascular negative cardio ROS       Neuro/Psych Anxiety Depression negative neurological ROS     GI/Hepatic Neg liver ROS, GERD  Medicated,  Endo/Other  negative endocrine ROS  Renal/GU negative Renal ROS     Musculoskeletal   Abdominal   Peds  Hematology  (+) anemia ,   Anesthesia Other Findings   Reproductive/Obstetrics                             Anesthesia Physical Anesthesia Plan  ASA: II  Anesthesia Plan: Epidural   Post-op Pain Management:    Induction:   Airway Management Planned:   Additional Equipment:   Intra-op Plan:   Post-operative Plan:   Informed Consent: I have reviewed the patients History and Physical, chart, labs and discussed the procedure including the risks, benefits and alternatives for the proposed anesthesia with the patient or authorized representative who has indicated his/her understanding and acceptance.     Plan Discussed with:   Anesthesia Plan Comments:         Anesthesia Quick Evaluation

## 2016-04-04 NOTE — Progress Notes (Addendum)
L&D Progress Note  S: CTSP due to FHR decelerations. Nurse reports that there was SROM at 1444-small amt clear fluid. Variable decelerations began with SROM. Position changes have not resolved declerations.  O:BP 98/64   Pulse 88   Temp 98.2 F (36.8 C) (Oral)   Resp 16   Ht 5\' 4"  (1.626 m)   Wt 78.5 kg (173 lb)   LMP 06/23/2015   SpO2 99%   BMI 29.70 kg/m   General: patient resting comfortably after epidural. FHR 145 with decelerations with moderate variability, and decelerations to 90-100 with contractions. Contractions q 2-4 min apart on Pitocin 8 miu/min Cervix: 5.5/80-90%/-1  A: Variable decelerations-Cat2 strip  P: IUPC inserted Amnioinfusion 300 ml LR in 30 min then 6250ml/hr Position changes O2 Pitocin off.  [name], CNM

## 2016-04-04 NOTE — Anesthesia Procedure Notes (Signed)
Epidural Patient location during procedure: OB Start time: 04/04/2016 2:17 PM End time: 04/04/2016 2:39 PM  Staffing Performed: anesthesiologist   Preanesthetic Checklist Completed: patient identified, site marked, surgical consent, pre-op evaluation, timeout performed, IV checked, risks and benefits discussed and monitors and equipment checked  Epidural Patient position: sitting Prep: Betadine Patient monitoring: heart rate, continuous pulse ox and blood pressure Approach: midline Location: L4-L5 Injection technique: LOR saline  Needle:  Needle type: Tuohy  Needle gauge: 17 G Needle length: 9 cm and 9 Needle insertion depth: 8 cm Catheter type: closed end flexible Catheter size: 20 Guage Catheter at skin depth: 11 cm Test dose: negative and 1.5% lidocaine with Epi 1:200 K  Assessment Events: blood not aspirated, injection not painful, no injection resistance, negative IV test and no paresthesia  Additional Notes   Patient tolerated the insertion well without complications.Reason for block:procedure for pain

## 2016-04-04 NOTE — Progress Notes (Signed)
S: G3P2 Pt at 3750w6d with c/o contractions every 5 minutes overnight when admitted and has had contractions for several days. She has not been able to get much rest. She is scheduled for induction tonight for postdates. She is still uncomfortable and said she did not sleep with the morphine. She says the contractions are not as frequent as when she arrived.  O: Vital Signs: BP (!) 76/34 (BP Location: Right Arm)   Pulse 90   Temp 98.2 F (36.8 C) (Oral)   Resp 16   Ht 5\' 4"  (1.626 m)   Wt 173 lb (78.5 kg)   BMI 29.70 kg/m  recheck of BP is 115/70  Constitutional: Well nourished, well developed female uncomfortable with contractions.  HEENT: normal Skin: Warm and dry.  Cardiovascular: Regular rate and rhythm.   Extremity: no evidence of DVT Respiratory: Clear to auscultation bilateral. Normal respiratory effort Abdomen: FHT present, palpates moderate strength with ctx Back: no CVAT Neuro: DTRs 2+, Cranial nerves grossly intact Psych: Alert and Oriented x3. No memory deficits. Normal mood and affect.  MS: normal gait, normal bilateral lower extremity ROM/strength/stability.  Pelvic exam:  is not limited by body habitus EGBUS: within normal limits Vagina: within normal limits and with normal mucosa blood in the vault Cervix: 3 cm/6970/-513  A: 26 year old G3P2 with IUP at 4950w6d in early labor S/P IM morphine for therapeutic rest  P: Admit for induction/augmentation of labor Allow to have a meal prior to beginning pitocin   Tresea MallJane Kyler Germer, CNM

## 2016-04-04 NOTE — H&P (Signed)
OB History & Physical   History of Present Illness:  Chief Complaint:   HPI:  [name] is a 26 y.o. [phone] female at 110w6d dated by LMP=15 week ultrasound.  Her pregnancy has been complicated by late entry to care at 27 weeks, tobacco use, positive depression screen (on no meds), treatment for Chlamydia and trichimonas12/2017 with TOC negative for Chlamydia x2, and anemia.  She presented to L&D for evaluation of contractions and was initially treated with morphine rest. She continued to feel painful contractions, but cervix has changed a little and she is now 3 cm. No vaginal bleeding or symptoms of SROM. Baby active.    Prenatal care site: Prenatal care at [facility]. Wants to breast feed. TDAP 01/16/2016. Unsure of contraception. Not currently with FOB.  Pelvis proven to 8#11 oz. Has had two vaginal deliveries.      Maternal Medical History:   Past Medical History:  Diagnosis Date  . Anemia   . Anxiety   . Chlamydia infection affecting pregnancy in second trimester   . Depression     Past Surgical History:  Procedure Laterality Date  . NO PAST SURGERIES      No Known Allergies  Prior to Admission medications   Medication Sig Start Date End Date Taking? Authorizing Provider  ferrous sulfate 325 (65 FE) MG tablet Take 325 mg by mouth daily with breakfast.    Historical Provider, MD  Prenatal Multivit-Min-Fe-FA (PRENATAL VITAMINS PO) Take by mouth.    Historical Provider, MD          Social History: She  reports that she has been smoking Cigarettes.  She has a 1.32 pack-year smoking history. She has never used smokeless tobacco. She reports that she does not drink alcohol or use drugs. She has decreased her cigs from 2 PPD to 1/2 PPD with this pregnancy  Family History: family history includes Anxiety disorder in her mother; Depression in her mother.   Review of Systems: Negative x 10 systems reviewed except as noted in the HPI.      Physical Exam:  Vital Signs: BP  108/70 (BP Location: Left Arm)   Pulse 90   Temp 98.2 F (36.8 C) (Oral)   Resp 16   Ht 5\' 4"  (1.626 m)   Wt 78.5 kg (173 lb)   LMP 06/23/2015   BMI 29.70 kg/m  General: no acute distress.  HEENT: normocephalic, atraumatic Heart: regular rate & rhythm.  No murmurs/rubs/gallops Lungs: clear to auscultation bilaterally Abdomen: soft, gravid, non-tender;  EFW: 8#15 oz on last week's ultrasound Pelvic:   External: Normal external female genitalia  Cervix: Dilation: 3 / Effacement (%): 70 / Station: -3   Extremities: non-tender, symmetric, no edema bilaterally.  DTRs:+2  Neurologic: Alert & oriented x 3.    Pertinent Results:  Prenatal Labs: Blood type/Rh O positive  Antibody screen negative  Rubella Varicella Immune immune  RPR Non reactive  HBsAg negative  HIV negative  GC Chlamydia Negative Positive 12/17, with TOC neg 1/27 and  2/25     Genetic screening Too late  1 hour GTT 101  3 hour GTT NA  GBS negative on 2/23   Baseline FHR: 145 with moderate variability Contractions: q4-6 min apart  Assessment:  [name] is a 26 y.o. [id number] female at [redacted]w[redacted]d in early labor. FWB: Cat 1 tracing   Plan:  1. Admit to Labor & Delivery    2. CBC, T&S, regular diet for breakfast-then clear liquids 3. GBS negative.  4. Consents obtained. 5.  O POS/ RI/ VI 6. Breast/ undecided on contraception 7. Can shower after breakfast, then IV Fentanyl and/or epidural when appropriate  Farrel ConnersColleen Syrena Burges  04/04/2016 9:53 AM

## 2016-04-04 NOTE — OB Triage Note (Signed)
Pt presents to L&D with c/o abd pain and contractions q5 min. Denies LOF or vaginal bleeding. Reports decreased FM x last 3 days. EFM applied and explained. Plan to monitor fetal and maternal well being and assess for labor.

## 2016-04-05 DIAGNOSIS — Z3A40 40 weeks gestation of pregnancy: Secondary | ICD-10-CM

## 2016-04-05 DIAGNOSIS — D649 Anemia, unspecified: Secondary | ICD-10-CM

## 2016-04-05 LAB — CBC
HEMATOCRIT: 28.4 % — AB (ref 35.0–47.0)
HEMOGLOBIN: 9.1 g/dL — AB (ref 12.0–16.0)
MCH: 25.8 pg — ABNORMAL LOW (ref 26.0–34.0)
MCHC: 32 g/dL (ref 32.0–36.0)
MCV: 80.5 fL (ref 80.0–100.0)
Platelets: 292 10*3/uL (ref 150–440)
RBC: 3.53 MIL/uL — ABNORMAL LOW (ref 3.80–5.20)
RDW: 16.7 % — ABNORMAL HIGH (ref 11.5–14.5)
WBC: 13.9 10*3/uL — ABNORMAL HIGH (ref 3.6–11.0)

## 2016-04-05 NOTE — Progress Notes (Signed)
Post Partum Day 1 Subjective: up ad lib, voiding, tolerating PO and having some paresthesias in her left foot (cold and burning feeling on sole of foot).  Baby in the nursery with jaundice . Patient is pumping.  Objective: Blood pressure (!) 102/59, pulse 70, temperature 97.9 F (36.6 C), temperature source Oral, resp. rate 18, height 5\' 4"  (1.626 m), weight 78.5 kg (173 lb), last menstrual period 06/23/2015, SpO2 100 %, unknown if currently breastfeeding.  Physical Exam:  General: alert, cooperative and no distress Lochia: appropriate Uterine Fundus: firm/ at U/ ML/ NT DVT Evaluation: No evidence of DVT seen on physical exam.   Recent Labs  04/04/16 1030 04/05/16 0420  HGB 10.1* 9.1*  HCT 30.7* 28.4*  WBC 13.9* 13.9*  PLT 314 292    Assessment/Plan: Stable PPD #1  MIld anemia-on iron and vitamins  Paresthesia on left foot possibly from epidural  Breast  O POS/ RI/ VI/ GBS negative  TDAp UTD  Probable discharge tomorrow         LOS: 1 day   [name] 04/05/2016, 8:11 PM

## 2016-04-05 NOTE — Anesthesia Postprocedure Evaluation (Signed)
Anesthesia Post Note  Patient: [name]  Procedure(s) Performed: * No procedures listed *  Patient location during evaluation: Mother Baby Anesthesia Type: Epidural Level of consciousness: awake and alert Pain management: pain level controlled Vital Signs Assessment: post-procedure vital signs reviewed and stable Respiratory status: spontaneous breathing, nonlabored ventilation and respiratory function stable Cardiovascular status: stable Postop Assessment: no headache, no backache and epidural receding Anesthetic complications: no     Last Vitals:  Vitals:   04/05/16 0700 04/05/16 1204  BP: (!) 96/58 (!) 100/56  Pulse: 84 70  Resp: 17 18  Temp: 36.8 C 36.8 C    Last Pain:  Vitals:   04/05/16 1204  TempSrc: Oral  PainSc:                  [name]

## 2016-04-05 NOTE — Progress Notes (Signed)
C. Sharen HonesGutierrez here to assess Pt. I reported that Pt. C/O Left foot feeling," numb and kinda warm " when ambulating intermittently today. Feet are both normal in color and warm to touch with positive pedal pulses equal and strong. Denies calf pain with flexion and extension of feet. Maebelle Munroe. Gutierrez assessed Pt. And stated Anesthesiology will assess in A.M. Will cont. To follow closely.

## 2016-04-06 ENCOUNTER — Encounter: Payer: Self-pay | Admitting: Anesthesiology

## 2016-04-06 NOTE — Progress Notes (Signed)
While rounding, CH made initial visit to room 342. Pt presented very sad. Pt stated that she will discharge today, but her newborn son will have to stay due to jaundice. Pt asked for prayer that her son will get well and be able to come home with her soon. Prayer and empathetic listening was provided.    04/06/16 0900  Clinical Encounter Type  Visited With Patient;Health care provider  Visit Type Initial;Spiritual support  Referral From Nurse  Spiritual Encounters  Spiritual Needs Prayer;Emotional

## 2016-04-06 NOTE — Brief Op Note (Signed)
1030  Pt is doing well except for some numbness in L heel. I told her it could be from positioning from delivery, or the epidural. It will take some time for the numbness to disappear. She is to call if it gets worse.

## 2016-04-06 NOTE — Progress Notes (Signed)
Patient discharged home, infant remains in SCN. Discharge instructions, prescriptions and follow up appointment given to and reviewed with patient. Patient verbalized understanding. Escorted out via wheelchair by [employer].

## 2016-04-06 NOTE — Progress Notes (Signed)
Anesthesia, Dr. Maisie Fushomas, here to see patient. States patient is okay for discharge.

## 2019-03-14 ENCOUNTER — Other Ambulatory Visit: Payer: Self-pay

## 2019-03-14 ENCOUNTER — Ambulatory Visit: Payer: Medicaid Other | Admitting: Physician Assistant

## 2019-03-14 ENCOUNTER — Encounter: Payer: Self-pay | Admitting: Physician Assistant

## 2019-03-14 DIAGNOSIS — N760 Acute vaginitis: Secondary | ICD-10-CM

## 2019-03-14 DIAGNOSIS — Z113 Encounter for screening for infections with a predominantly sexual mode of transmission: Secondary | ICD-10-CM

## 2019-03-14 DIAGNOSIS — F419 Anxiety disorder, unspecified: Secondary | ICD-10-CM

## 2019-03-14 DIAGNOSIS — Z792 Long term (current) use of antibiotics: Secondary | ICD-10-CM

## 2019-03-14 DIAGNOSIS — B9689 Other specified bacterial agents as the cause of diseases classified elsewhere: Secondary | ICD-10-CM

## 2019-03-14 LAB — WET PREP FOR TRICH, YEAST, CLUE
Trichomonas Exam: NEGATIVE
Yeast Exam: NEGATIVE

## 2019-03-14 MED ORDER — DOXYCYCLINE HYCLATE 100 MG PO TABS: 100.0000 mg | ORAL_TABLET | Freq: Two times a day (BID) | ORAL | 0 refills | Status: AC

## 2019-03-14 MED ORDER — METRONIDAZOLE 500 MG PO TABS: 500.0000 mg | ORAL_TABLET | Freq: Two times a day (BID) | ORAL | 0 refills | Status: AC

## 2019-03-14 MED ORDER — CEFTRIAXONE SODIUM 250 MG IJ SOLR
500.0000 mg | Freq: Once | INTRAMUSCULAR | Status: AC
  Administered 2019-03-14: 11:00:00 500 mg via INTRAMUSCULAR
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
## 2019-03-14 NOTE — Progress Notes (Signed)
Wet mount reviewed; pt treated for BV and also prophylacticly per provider orders. Provider orders completed.

## 2019-03-19 LAB — GONOCOCCUS CULTURE

## 2019-03-20 LAB — HM HIV SCREENING LAB: HM HIV Screening: NEGATIVE

## 2019-03-20 LAB — HEPATITIS B SURFACE ANTIGEN

## 2019-03-20 LAB — HM HEPATITIS C SCREENING LAB: HM Hepatitis Screen: NEGATIVE

## 2019-04-06 ENCOUNTER — Telehealth: Payer: Self-pay | Admitting: Family Medicine

## 2019-04-06 NOTE — Telephone Encounter (Signed)
Phone call to pt. Pt knew password from 03/14/2019 visit; discussed test results with pt.

## 2019-04-06 NOTE — Telephone Encounter (Signed)
Phone call to pt. Pt did not answer, and unable to leave message due to voicemail not set up.

## 2019-04-06 NOTE — Telephone Encounter (Signed)
Patient wants test results, concerned she might of had a missed call
